# Patient Record
Sex: Male | Born: 1990 | Race: Black or African American | Hispanic: No | Marital: Married | Smoking: Former smoker
Health system: Southern US, Community
[De-identification: ages and names within clinical notes are randomized; demographics above are authoritative.]

## PROBLEM LIST (undated history)

## (undated) DIAGNOSIS — K219 Gastro-esophageal reflux disease without esophagitis: Secondary | ICD-10-CM

## (undated) DIAGNOSIS — IMO0001 Reserved for inherently not codable concepts without codable children: Secondary | ICD-10-CM

## (undated) HISTORY — PX: LEG SURGERY: SHX1003

---
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
## 2010-02-10 ENCOUNTER — Emergency Department (HOSPITAL_COMMUNITY): Admission: EM | Admit: 2010-02-10 | Discharge: 2010-02-10 | Payer: Self-pay | Admitting: Emergency Medicine

## 2010-05-14 IMAGING — US US ABDOMEN COMPLETE
1 series · 14 of 25 positions shown · non-contrast
Comparison: None

CLINICAL DATA: Nausea vomiting.  Epigastric tenderness.

COMPLETE ABDOMINAL ULTRASOUND

[Series 1: us abdomen complete · 0.28mm/px · 14 of 56 slices shown]
[im 1/56]
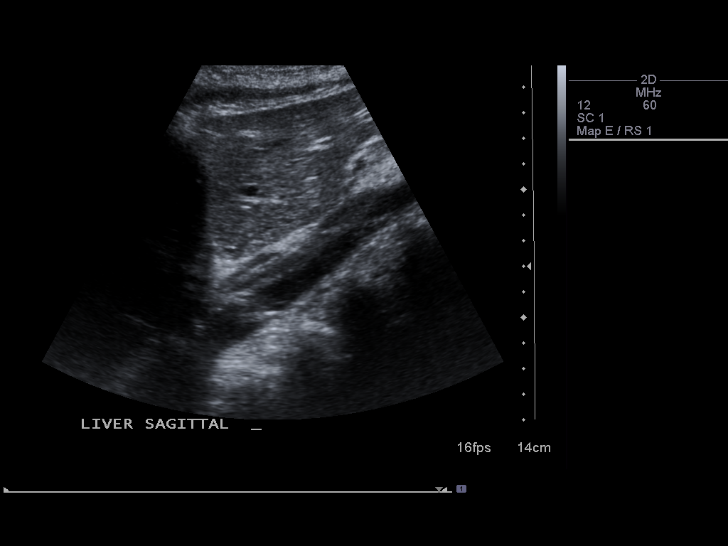
[im 5/56]
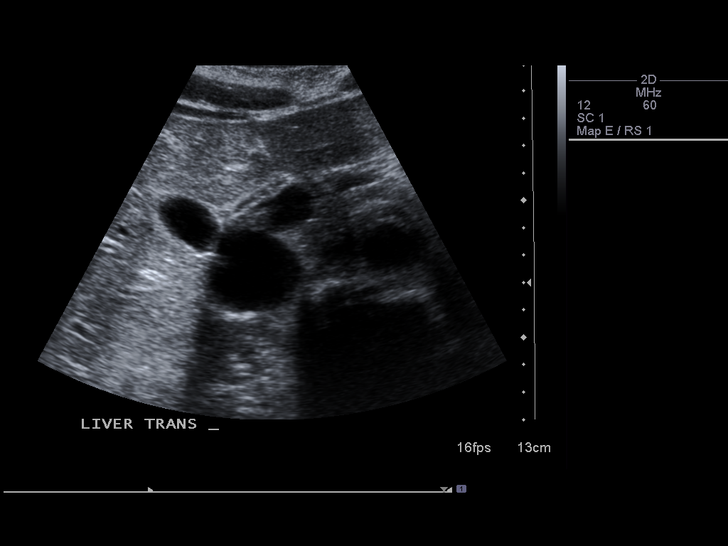
[im 10/56]
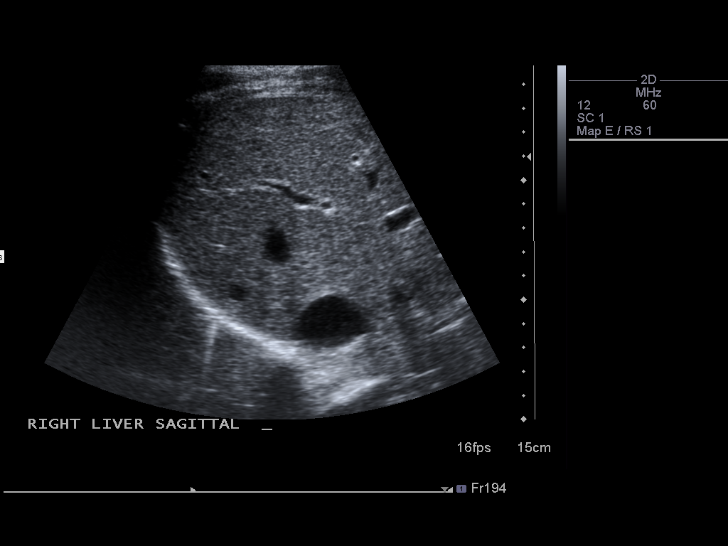
[im 14/56]
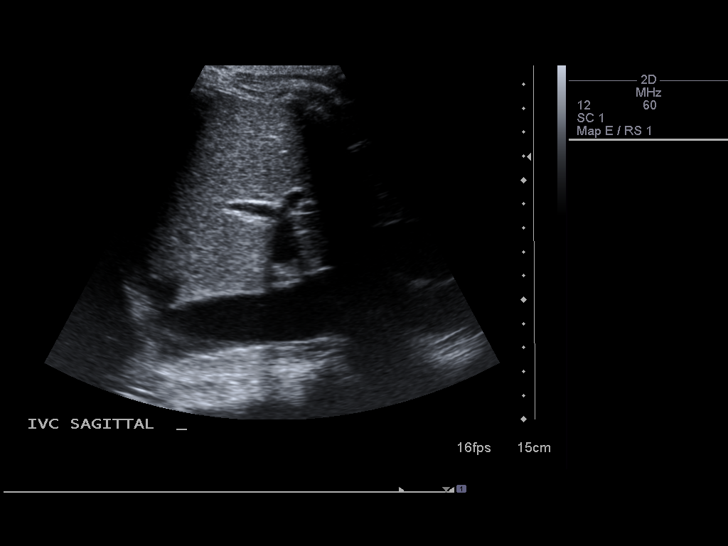
[im 19/56]
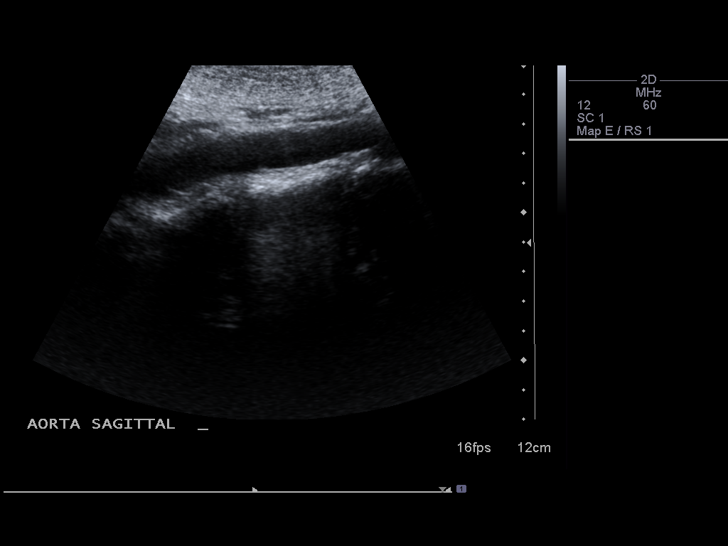
[im 21/56]
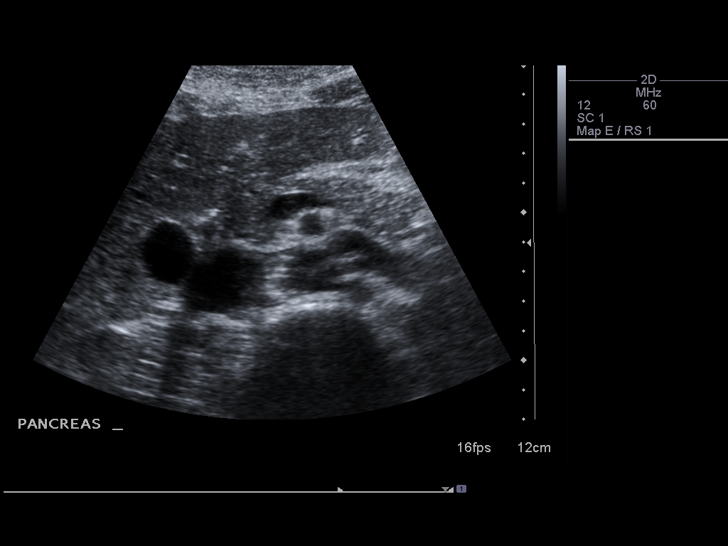
[im 26/56]
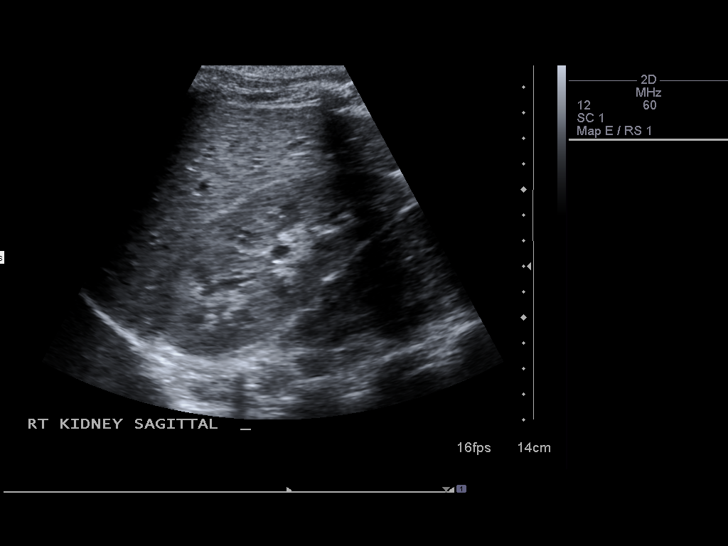
[im 30/56]
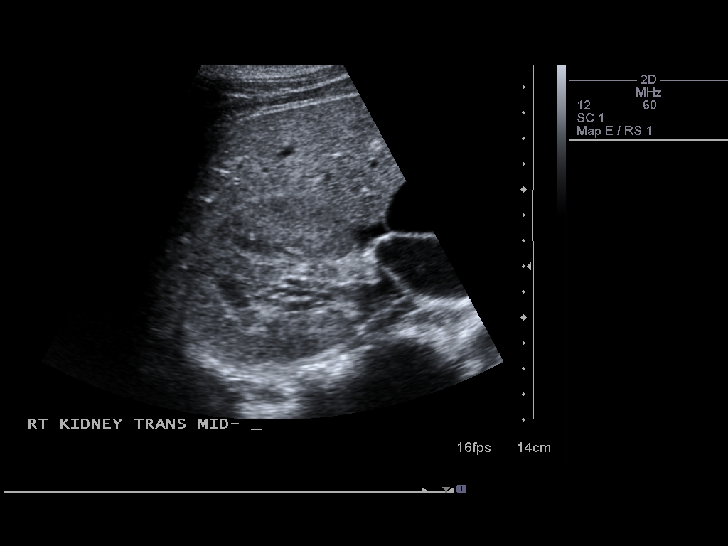
[im 35/56]
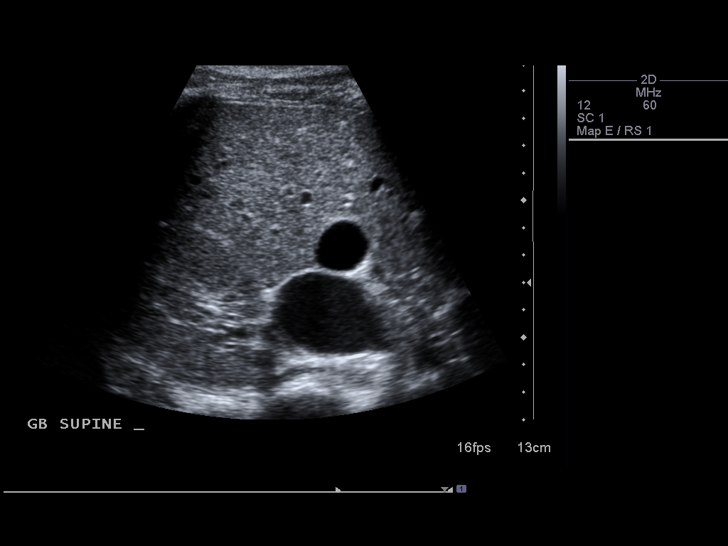
[im 37/56]
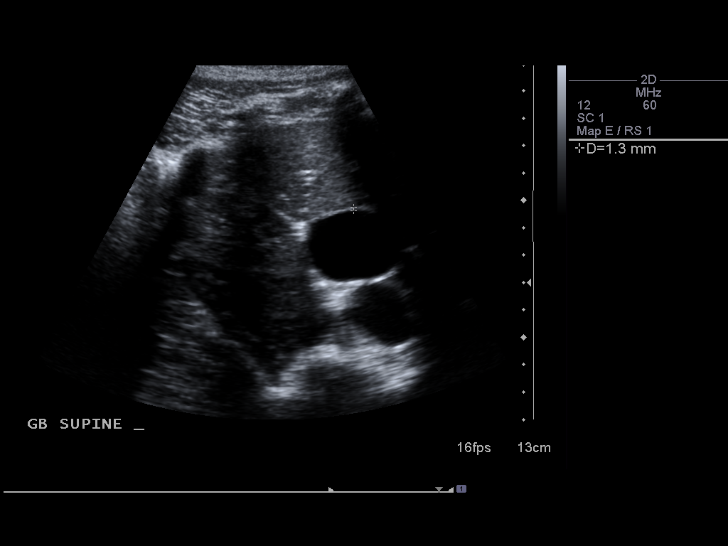
[im 42/56]
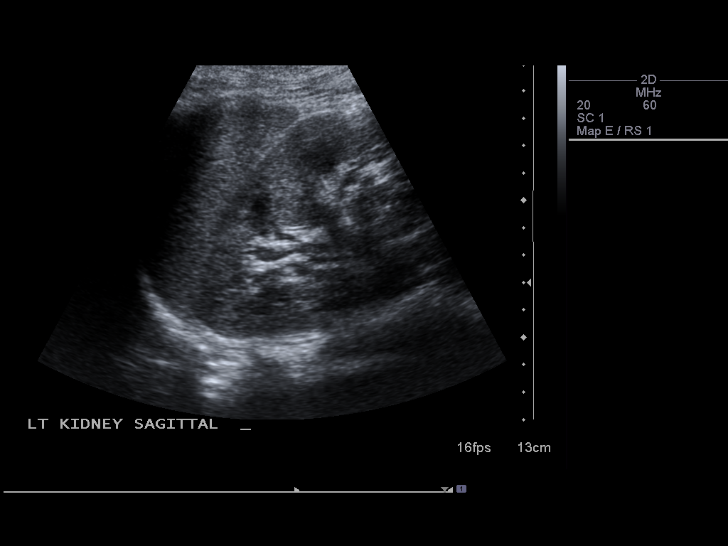
[im 46/56]
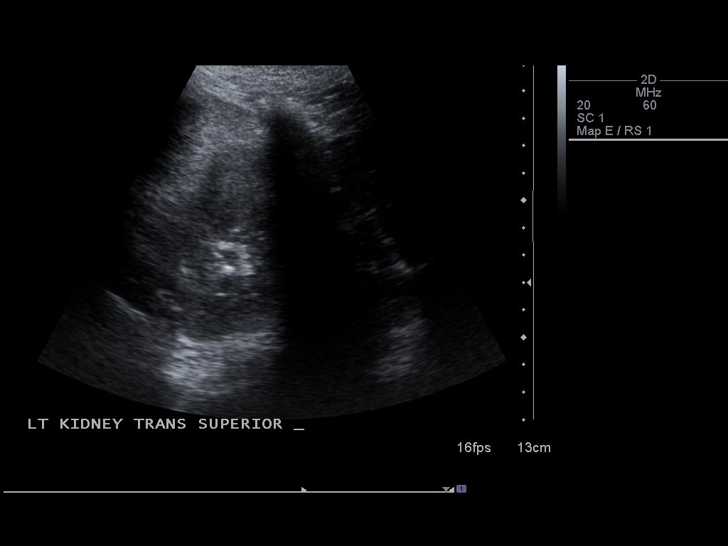
[im 51/56]
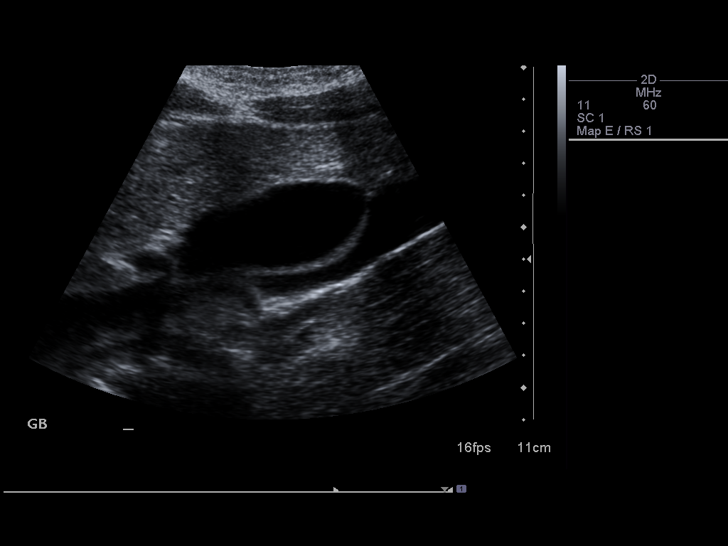
[im 56/56]
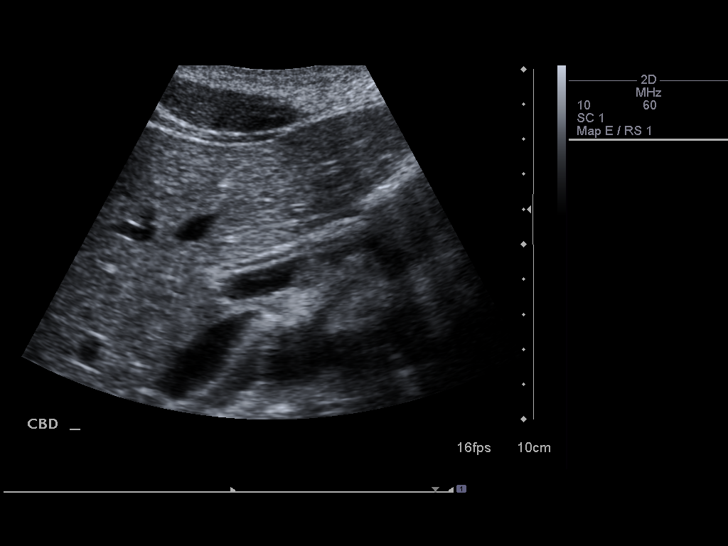

[14 of 25 positions shown; findings below may reference images not displayed]

FINDINGS: Normal gallbladder.  No biliary ductal dilatation.
Common duct measures 3.1 mm in diameter.  No hepatic, splenic,
renal, or pancreatic abnormality.  Spleen measures 7.1 cm in
length.  The right and left kidneys measure 11.4 cm and 11.1 cm in
length, respectively.  Patent IVC.  The maximum diameter of the
abdominal aorta is 2.0 cm.
IMPRESSION: Negative abdominal ultrasound.

## 2011-01-12 LAB — DIFFERENTIAL
Basophils Absolute: 0 10*3/uL (ref 0.0–0.1)
Basophils Relative: 1 % (ref 0–1)
Eosinophils Absolute: 0.1 10*3/uL (ref 0.0–1.2)
Eosinophils Relative: 1 % (ref 0–5)
Lymphocytes Relative: 26 % (ref 24–48)
Lymphs Abs: 1.4 10*3/uL (ref 1.1–4.8)
Monocytes Absolute: 0.4 10*3/uL (ref 0.2–1.2)
Monocytes Relative: 8 % (ref 3–11)
Neutro Abs: 3.5 10*3/uL (ref 1.7–8.0)
Neutrophils Relative %: 64 % (ref 43–71)

## 2011-01-12 LAB — COMPREHENSIVE METABOLIC PANEL
ALT: 14 U/L (ref 0–53)
AST: 18 U/L (ref 0–37)
Albumin: 4 g/dL (ref 3.5–5.2)
Alkaline Phosphatase: 57 U/L (ref 52–171)
BUN: 10 mg/dL (ref 6–23)
CO2: 25 mEq/L (ref 19–32)
Calcium: 9.2 mg/dL (ref 8.4–10.5)
Chloride: 106 mEq/L (ref 96–112)
Creatinine, Ser: 0.82 mg/dL (ref 0.4–1.5)
Glucose, Bld: 87 mg/dL (ref 70–99)
Potassium: 3.5 mEq/L (ref 3.5–5.1)
Sodium: 140 mEq/L (ref 135–145)
Total Bilirubin: 1 mg/dL (ref 0.3–1.2)
Total Protein: 6.6 g/dL (ref 6.0–8.3)

## 2011-01-12 LAB — URINALYSIS, ROUTINE W REFLEX MICROSCOPIC
Glucose, UA: NEGATIVE mg/dL
Hgb urine dipstick: NEGATIVE
Ketones, ur: >80 mg/dL — AB
Leukocytes, UA: NEGATIVE
Nitrite: NEGATIVE
Protein, ur: 100 mg/dL — AB
Specific Gravity, Urine: 1.038 — ABNORMAL HIGH (ref 1.005–1.030)
Urobilinogen, UA: 1 mg/dL (ref 0.0–1.0)
pH: 6 (ref 5.0–8.0)

## 2011-01-12 LAB — CBC
HCT: 36.7 % (ref 36.0–49.0)
Hemoglobin: 12.4 g/dL (ref 12.0–16.0)
MCHC: 33.8 g/dL (ref 31.0–37.0)
MCV: 81.1 fL (ref 78.0–98.0)
Platelets: 180 10*3/uL (ref 150–400)
RBC: 4.52 MIL/uL (ref 3.80–5.70)
RDW: 15.6 % — ABNORMAL HIGH (ref 11.4–15.5)
WBC: 5.4 10*3/uL (ref 4.5–13.5)

## 2011-01-12 LAB — URINE MICROSCOPIC-ADD ON

## 2011-01-12 LAB — LIPASE, BLOOD: Lipase: 12 U/L (ref 11–59)

## 2011-01-12 LAB — GLUCOSE, CAPILLARY

## 2014-04-01 ENCOUNTER — Emergency Department (HOSPITAL_COMMUNITY)
Admission: EM | Admit: 2014-04-01 | Discharge: 2014-04-01 | Disposition: A | Discharge status: Home / Self Care | Payer: Self-pay | Attending: Emergency Medicine | Admitting: Emergency Medicine

## 2014-04-01 ENCOUNTER — Encounter (HOSPITAL_COMMUNITY): Payer: Self-pay | Admitting: Emergency Medicine

## 2014-04-01 ENCOUNTER — Emergency Department (HOSPITAL_COMMUNITY): Payer: Self-pay

## 2014-04-01 DIAGNOSIS — K2100 Gastro-esophageal reflux disease with esophagitis, without bleeding: Secondary | ICD-10-CM | POA: Insufficient documentation

## 2014-04-01 DIAGNOSIS — F17200 Nicotine dependence, unspecified, uncomplicated: Secondary | ICD-10-CM | POA: Insufficient documentation

## 2014-04-01 DIAGNOSIS — N509 Disorder of male genital organs, unspecified: Secondary | ICD-10-CM | POA: Insufficient documentation

## 2014-04-01 LAB — COMPREHENSIVE METABOLIC PANEL
ALK PHOS: 37 U/L — AB (ref 39–117)
ALT: 13 U/L (ref 0–53)
AST: 17 U/L (ref 0–37)
Albumin: 4 g/dL (ref 3.5–5.2)
BILIRUBIN TOTAL: 0.6 mg/dL (ref 0.3–1.2)
BUN: 13 mg/dL (ref 6–23)
CALCIUM: 9.2 mg/dL (ref 8.4–10.5)
CHLORIDE: 104 meq/L (ref 96–112)
CO2: 27 meq/L (ref 19–32)
Creatinine, Ser: 0.97 mg/dL (ref 0.50–1.35)
GLUCOSE: 76 mg/dL (ref 70–99)
Potassium: 3.5 mEq/L — ABNORMAL LOW (ref 3.7–5.3)
SODIUM: 141 meq/L (ref 137–147)
Total Protein: 6.7 g/dL (ref 6.0–8.3)

## 2014-04-01 LAB — CBC WITH DIFFERENTIAL/PLATELET
BASOS ABS: 0 10*3/uL (ref 0.0–0.1)
Basophils Relative: 1 % (ref 0–1)
EOS PCT: 2 % (ref 0–5)
Eosinophils Absolute: 0.1 10*3/uL (ref 0.0–0.7)
HEMATOCRIT: 38.8 % — AB (ref 39.0–52.0)
Hemoglobin: 13 g/dL (ref 13.0–17.0)
LYMPHS ABS: 1.8 10*3/uL (ref 0.7–4.0)
LYMPHS PCT: 40 % (ref 12–46)
MCH: 27.1 pg (ref 26.0–34.0)
MCHC: 33.5 g/dL (ref 30.0–36.0)
MCV: 81 fL (ref 78.0–100.0)
Monocytes Absolute: 0.4 10*3/uL (ref 0.1–1.0)
Monocytes Relative: 8 % (ref 3–12)
NEUTROS ABS: 2.3 10*3/uL (ref 1.7–7.7)
Neutrophils Relative %: 49 % (ref 43–77)
PLATELETS: 175 10*3/uL (ref 150–400)
RBC: 4.79 MIL/uL (ref 4.22–5.81)
RDW: 14.5 % (ref 11.5–15.5)
WBC: 4.6 10*3/uL (ref 4.0–10.5)

## 2014-04-01 MED ORDER — SUCRALFATE 1 GM/10ML PO SUSP: 1.0000 g | Freq: Three times a day (TID) | ORAL | Status: DC

## 2014-04-01 MED ORDER — RANITIDINE HCL 150 MG PO TABS: 150.0000 mg | ORAL_TABLET | Freq: Two times a day (BID) | ORAL | Status: DC

## 2014-04-01 NOTE — ED Notes (Signed)
Pt states constant  heartburn with nausea and vomiting when eating past 7pm

## 2014-04-01 NOTE — ED Notes (Signed)
Pt. Stated, I've been having acid reflux in the morning when I wake up, its like Im vomiting acid for 2 months.  I also have a bump on my testicle.

## 2014-04-01 NOTE — ED Notes (Signed)
Pt refused wc. Pt alert x4 escort to door. Steady gait.

## 2014-04-01 NOTE — Discharge Instructions (Signed)
Gastroesophageal Reflux Disease, Adult  Gastroesophageal reflux disease (GERD) happens when acid from your stomach flows up into the esophagus. When acid comes in contact with the esophagus, the acid causes soreness (inflammation) in the esophagus. Over time, GERD may create small holes (ulcers) in the lining of the esophagus.  CAUSES   · Increased body weight. This puts pressure on the stomach, making acid rise from the stomach into the esophagus.  · Smoking. This increases acid production in the stomach.  · Drinking alcohol. This causes decreased pressure in the lower esophageal sphincter (valve or ring of muscle between the esophagus and stomach), allowing acid from the stomach into the esophagus.  · Late evening meals and a full stomach. This increases pressure and acid production in the stomach.  · A malformed lower esophageal sphincter.  Sometimes, no cause is found.  SYMPTOMS   · Burning pain in the lower part of the mid-chest behind the breastbone and in the mid-stomach area. This may occur twice a week or more often.  · Trouble swallowing.  · Sore throat.  · Dry cough.  · Asthma-like symptoms including chest tightness, shortness of breath, or wheezing.  DIAGNOSIS   Your caregiver may be able to diagnose GERD based on your symptoms. In some cases, X-rays and other tests may be done to check for complications or to check the condition of your stomach and esophagus.  TREATMENT   Your caregiver may recommend over-the-counter or prescription medicines to help decrease acid production. Ask your caregiver before starting or adding any new medicines.   HOME CARE INSTRUCTIONS   · Change the factors that you can control. Ask your caregiver for guidance concerning weight loss, quitting smoking, and alcohol consumption.  · Avoid foods and drinks that make your symptoms worse, such as:  ¨ Caffeine or alcoholic drinks.  ¨ Chocolate.  ¨ Peppermint or mint flavorings.  ¨ Garlic and onions.  ¨ Spicy foods.  ¨ Citrus fruits,  such as oranges, lemons, or limes.  ¨ Tomato-based foods such as sauce, chili, salsa, and pizza.  ¨ Fried and fatty foods.  · Avoid lying down for the 3 hours prior to your bedtime or prior to taking a nap.  · Eat small, frequent meals instead of large meals.  · Wear loose-fitting clothing. Do not wear anything tight around your waist that causes pressure on your stomach.  · Raise the head of your bed 6 to 8 inches with wood blocks to help you sleep. Extra pillows will not help.  · Only take over-the-counter or prescription medicines for pain, discomfort, or fever as directed by your caregiver.  · Do not take aspirin, ibuprofen, or other nonsteroidal anti-inflammatory drugs (NSAIDs).  SEEK IMMEDIATE MEDICAL CARE IF:   · You have pain in your arms, neck, jaw, teeth, or back.  · Your pain increases or changes in intensity or duration.  · You develop nausea, vomiting, or sweating (diaphoresis).  · You develop shortness of breath, or you faint.  · Your vomit is green, yellow, black, or looks like coffee grounds or blood.  · Your stool is red, bloody, or black.  These symptoms could be signs of other problems, such as heart disease, gastric bleeding, or esophageal bleeding.  MAKE SURE YOU:   · Understand these instructions.  · Will watch your condition.  · Will get help right away if you are not doing well or get worse.  Document Released: 06/30/2005 Document Revised: 12/13/2011 Document Reviewed: 04/09/2011  ExitCare® Patient   Information ©2015 [employer]. This information is not intended to replace advice given to you by your health care provider. Make sure you discuss any questions you have with your health care provider.  Food Choices for Gastroesophageal Reflux Disease  When you have gastroesophageal reflux disease (GERD), the foods you eat and your eating habits are very important. Choosing the right foods can help ease the discomfort of GERD.  WHAT GENERAL GUIDELINES DO I NEED TO FOLLOW?  · Choose fruits,  vegetables, whole grains, low-fat dairy products, and low-fat meat, fish, and poultry.  · Limit fats such as oils, salad dressings, butter, nuts, and avocado.  · Keep a food diary to identify foods that cause symptoms.  · Avoid foods that cause reflux. These may be different for different people.  · Eat frequent small meals instead of three large meals each day.  · Eat your meals slowly, in a relaxed setting.  · Limit fried foods.  · Cook foods using methods other than frying.  · Avoid drinking alcohol.  · Avoid drinking large amounts of liquids with your meals.  · Avoid bending over or lying down until 2-3 hours after eating.  WHAT FOODS ARE NOT RECOMMENDED?  The following are some foods and drinks that may worsen your symptoms:  Vegetables  Tomatoes. Tomato juice. Tomato and spaghetti sauce. Chili peppers. Onion and garlic. Horseradish.  Fruits  Oranges, grapefruit, and lemon (fruit and juice).  Meats  High-fat meats, fish, and poultry. This includes hot dogs, ribs, ham, sausage, salami, and bacon.  Dairy  Whole milk and chocolate milk. Sour cream. Cream. Butter. Ice cream. Cream cheese.   Beverages  Coffee and tea, with or without caffeine. Carbonated beverages or energy drinks.  Condiments  Hot sauce. Barbecue sauce.   Sweets/Desserts  Chocolate and cocoa. Donuts. Peppermint and spearmint.  Fats and Oils  High-fat foods, including French fries and potato chips.  Other  Vinegar. Strong spices, such as black pepper, white pepper, red pepper, cayenne, curry powder, cloves, ginger, and chili powder.  The items listed above may not be a complete list of foods and beverages to avoid. Contact your dietitian for more information.  Document Released: 09/20/2005 Document Revised: 09/25/2013 Document Reviewed: 07/25/2013  ExitCare® Patient Information ©2015 [employer]. This information is not intended to replace advice given to you by your health care provider. Make sure you discuss any questions you have with your  health care provider.

## 2014-04-01 NOTE — ED Notes (Signed)
Pt transported to xray 

## 2014-04-01 NOTE — ED Notes (Signed)
Pt request note to show to general manager stating he has acid reflux. Provider informed.

## 2014-04-01 NOTE — ED Provider Notes (Signed)
CSN: [id number]     Arrival date & time 04/01/14  1149 History   First MD Initiated Contact with Patient 04/01/14 1600     Chief Complaint  Patient presents with  . Gastrophageal Reflux  . Emesis  . Testicle Pain     (Consider location/radiation/quality/duration/timing/severity/associated sxs/prior Treatment) HPI Comments: Patient presents to the emergency department for multiple problems. Patient is complaining of increasing frequency of heartburn with nausea and vomiting. Patient reports that he is awakened early in the morning most mornings with nausea and vomits. There is no recurrent vomiting after that the rest of the day, however. He has been having frequent heartburn symptoms with this. He was told several years ago that he had acid reflux, that medicine for a while and it went away. The symptoms are similar.  Patient also concerned about a bump on his left testicle. Patient has had a nontender nodule on the scrotum for some time. He would like for her to be checked out. He says he saw the nurse at a clinic, had STD testing, but she did not know what this area was. There has not been any drainage from the region.  Patient is a 23 y.o. male presenting with GERD, vomiting, and testicular pain.  Gastrophageal Reflux  Emesis Testicle Pain    History reviewed. No pertinent past medical history. History reviewed. No pertinent past surgical history. No family history on file. History  Substance Use Topics  . Smoking status: Current Every Day Smoker  . Smokeless tobacco: Not on file  . Alcohol Use: No    Review of Systems  Gastrointestinal: Positive for nausea and vomiting.  Genitourinary:       Scrotal lesion  All other systems reviewed and are negative.     Allergies  Review of patient's allergies indicates no known allergies.  Home Medications   Prior to Admission medications   Medication Sig Start Date End Date Taking? Authorizing Provider  Simethicone (GAS-X PO)  Take 1 tablet by mouth daily as needed (gas).   Yes Historical Provider, MD   BP 121/86  Pulse 69  Temp(Src) 98.7 F (37.1 C) (Oral)  Resp 15  Wt 170 lb (77.111 kg)  SpO2 100% Physical Exam  Constitutional: He is oriented to person, place, and time. He appears well-developed and well-nourished. No distress.  HENT:  Head: Normocephalic and atraumatic.  Right Ear: Hearing normal.  Left Ear: Hearing normal.  Nose: Nose normal.  Mouth/Throat: Oropharynx is clear and moist and mucous membranes are normal.  Eyes: Conjunctivae and EOM are normal. Pupils are equal, round, and reactive to light.  Neck: Normal range of motion. Neck supple.  Cardiovascular: Regular rhythm, S1 normal and S2 normal.  Exam reveals no gallop and no friction rub.   No murmur heard. Pulmonary/Chest: Effort normal and breath sounds normal. No respiratory distress. He exhibits no tenderness.  Abdominal: Soft. Normal appearance and bowel sounds are normal. There is no hepatosplenomegaly. There is no tenderness. There is no rebound, no guarding, no tenderness at McBurney's point and negative Murphy's sign. No hernia.  Genitourinary: Penis normal.     Musculoskeletal: Normal range of motion.  Neurological: He is alert and oriented to person, place, and time. He has normal strength. No cranial nerve deficit or sensory deficit. Coordination normal. GCS eye subscore is 4. GCS verbal subscore is 5. GCS motor subscore is 6.  Skin: Skin is warm, dry and intact. No rash noted. No cyanosis.  Psychiatric: He has a normal mood and  affect. His speech is normal and behavior is normal. Thought content normal.    ED Course  Procedures (including critical care time) Labs Review Labs Reviewed  CBC WITH DIFFERENTIAL  COMPREHENSIVE METABOLIC PANEL    Imaging Review No results found.   EKG Interpretation None      MDM   Final diagnoses:  None  GERD Sebaceous Cyst  Patient complaining of increased gastroesophageal  reflux disease type symptoms. He has been previously diagnosed with GERD, not currently taking any medications. He has a benign abdominal exam. No tenderness in the right upper quadrant. No signs of peritonitis. He has not had any melena. Blood work is stable. Acute abdominal series unremarkable. Will initiate antacid coverage, refer to history of present illness.  Patient also complaining of a nodule on his scrotum. Examination is consistent with a sebaceous cyst, does not require any intervention. No other lesions or concern for STD.    Gilda Creasehristopher J. Pollina, MD 04/01/14 [phone]

## 2015-07-01 ENCOUNTER — Emergency Department (HOSPITAL_COMMUNITY)
Admission: EM | Admit: 2015-07-01 | Discharge: 2015-07-01 | Disposition: A | Discharge status: Home / Self Care | Payer: Self-pay | Source: Home / Self Care | Attending: Family Medicine | Admitting: Family Medicine

## 2015-07-01 ENCOUNTER — Encounter (HOSPITAL_COMMUNITY): Payer: Self-pay | Admitting: *Deleted

## 2015-07-01 DIAGNOSIS — L0291 Cutaneous abscess, unspecified: Secondary | ICD-10-CM

## 2015-07-01 MED ORDER — DOXYCYCLINE HYCLATE 100 MG PO CAPS: 100.0000 mg | ORAL_CAPSULE | Freq: Two times a day (BID) | ORAL | Status: AC

## 2015-07-01 NOTE — Discharge Instructions (Signed)

## 2015-07-01 NOTE — ED Provider Notes (Signed)
CSN: [id number]     Arrival date & time 07/01/15  1340 History   First MD Initiated Contact with Patient 07/01/15 1444     Chief Complaint  Patient presents with  . Abscess   (Consider location/radiation/quality/duration/timing/severity/associated sxs/prior Treatment) Patient is a 24 y.o. male presenting with abscess. The history is provided by the patient.  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 He developed an abscess on his left cheek several days ago. It's been draining yellow pus since. History reviewed. No pertinent past medical history. History reviewed. No pertinent past surgical history. History reviewed. No pertinent family history. Social History  Substance Use Topics  . Smoking status: Current Every Day Smoker  . Smokeless tobacco: None  . Alcohol Use: No    Review of Systems  Constitutional: Negative.  Negative for fever.  HENT: Negative.   Eyes: Negative.   Respiratory: Negative.   Cardiovascular: Negative.   Gastrointestinal: Negative.  Negative for nausea and vomiting.  Skin: Positive for rash and wound.  Neurological: Negative for headaches.    Allergies  Review of patient's allergies indicates no known allergies.  Home Medications   Prior to Admission medications   Medication Sig Start Date End Date Taking? Authorizing Provider  ranitidine (ZANTAC) 150 MG tablet Take 1 tablet  (150 mg total) by mouth 2 (two) times daily. 04/01/14   Gilda Crease, MD  Simethicone (GAS-X PO) Take 1 tablet by mouth daily as needed (gas).    Historical Provider, MD  sucralfate (CARAFATE) 1 GM/10ML suspension Take 10 mLs (1 g total) by mouth 4 (four) times daily -  with meals and at bedtime. 04/01/14   Gilda Crease, MD   Meds Ordered and Administered this Visit  Medications - No data to display  BP 120/81 mmHg  Pulse 60  Temp(Src) 98 F (36.7 C) (Oral)  Resp 18  SpO2 98% No data found.   Physical Exam  Constitutional: He appears well-developed and well-nourished.  HENT:  Head: Normocephalic.  Right Ear: External ear normal.  Left Ear: External ear normal.  Mouth/Throat: Oropharynx is clear and moist.  Eyes: Conjunctivae and EOM are normal. Pupils are equal, round, and reactive to light.  Neck: Normal range of motion. Neck supple.  Pulmonary/Chest: Effort normal.  Skin:  1 cm draining yellow pustule on left malar arch of his cheek.  Psychiatric: He has a normal mood and affect. His behavior is normal.  Nursing note and vitals reviewed.   ED Course  Procedures (including critical care time)  I was able to express a moderate amount of pus from the left cheek. I explained how patient can massage history gently to continue this process.  MDM       ICD-9-CM ICD-10-CM   1. Abscess 682.9 L02.91 doxycycline (VIBRAMYCIN) 100 MG capsule     Signed, Elvina Sidle, MD  Elvina Sidle, MD 07/01/15 [phone]

## 2015-07-01 NOTE — ED Notes (Signed)
Pt  Has   A facial  abcess  To  l  Side  Of  Face        That   He noticed   About  1  Week  Ago  He       denys  Any      injuury      He  Has  Had  On other  Areas  Of  Body

## 2015-09-19 ENCOUNTER — Emergency Department (HOSPITAL_COMMUNITY): Payer: Self-pay

## 2015-09-19 ENCOUNTER — Encounter (HOSPITAL_COMMUNITY): Payer: Self-pay | Admitting: Emergency Medicine

## 2015-09-19 ENCOUNTER — Emergency Department (HOSPITAL_COMMUNITY)
Admission: EM | Admit: 2015-09-19 | Discharge: 2015-09-19 | Disposition: A | Discharge status: Home / Self Care | Payer: Self-pay | Attending: Emergency Medicine | Admitting: Emergency Medicine

## 2015-09-19 DIAGNOSIS — R61 Generalized hyperhidrosis: Secondary | ICD-10-CM | POA: Insufficient documentation

## 2015-09-19 DIAGNOSIS — Z87891 Personal history of nicotine dependence: Secondary | ICD-10-CM | POA: Insufficient documentation

## 2015-09-19 DIAGNOSIS — R112 Nausea with vomiting, unspecified: Secondary | ICD-10-CM

## 2015-09-19 DIAGNOSIS — Z792 Long term (current) use of antibiotics: Secondary | ICD-10-CM | POA: Insufficient documentation

## 2015-09-19 DIAGNOSIS — L720 Epidermal cyst: Secondary | ICD-10-CM | POA: Insufficient documentation

## 2015-09-19 DIAGNOSIS — K219 Gastro-esophageal reflux disease without esophagitis: Secondary | ICD-10-CM | POA: Insufficient documentation

## 2015-09-19 DIAGNOSIS — Z79899 Other long term (current) drug therapy: Secondary | ICD-10-CM | POA: Insufficient documentation

## 2015-09-19 HISTORY — DX: Reserved for inherently not codable concepts without codable children: IMO0001

## 2015-09-19 HISTORY — DX: Gastro-esophageal reflux disease without esophagitis: K21.9

## 2015-09-19 LAB — LIPASE, BLOOD: Lipase: 21 U/L (ref 11–51)

## 2015-09-19 LAB — COMPREHENSIVE METABOLIC PANEL WITH GFR
ALT: 22 U/L (ref 17–63)
AST: 25 U/L (ref 15–41)
Albumin: 4.5 g/dL (ref 3.5–5.0)
Alkaline Phosphatase: 36 U/L — ABNORMAL LOW (ref 38–126)
Anion gap: 8 (ref 5–15)
BUN: 14 mg/dL (ref 6–20)
CO2: 24 mmol/L (ref 22–32)
Calcium: 9.4 mg/dL (ref 8.9–10.3)
Chloride: 106 mmol/L (ref 101–111)
Creatinine, Ser: 1 mg/dL (ref 0.61–1.24)
GFR calc Af Amer: >60 mL/min
GFR calc non Af Amer: >60 mL/min
Glucose, Bld: 102 mg/dL — ABNORMAL HIGH (ref 65–99)
Potassium: 3.8 mmol/L (ref 3.5–5.1)
Sodium: 138 mmol/L (ref 135–145)
Total Bilirubin: 0.8 mg/dL (ref 0.3–1.2)
Total Protein: 7.2 g/dL (ref 6.5–8.1)

## 2015-09-19 LAB — CBC
HEMATOCRIT: 43.9 % (ref 39.0–52.0)
Hemoglobin: 15 g/dL (ref 13.0–17.0)
MCH: 27.4 pg (ref 26.0–34.0)
MCHC: 34.2 g/dL (ref 30.0–36.0)
MCV: 80.1 fL (ref 78.0–100.0)
Platelets: 196 10*3/uL (ref 150–400)
RBC: 5.48 MIL/uL (ref 4.22–5.81)
RDW: 14.8 % (ref 11.5–15.5)
WBC: 6.8 10*3/uL (ref 4.0–10.5)

## 2015-09-19 LAB — URINALYSIS, ROUTINE W REFLEX MICROSCOPIC
Bilirubin Urine: NEGATIVE
Glucose, UA: NEGATIVE mg/dL
Hgb urine dipstick: NEGATIVE
Ketones, ur: 15 mg/dL — AB
Leukocytes, UA: NEGATIVE
Nitrite: NEGATIVE
Protein, ur: NEGATIVE mg/dL
Specific Gravity, Urine: 1.029 (ref 1.005–1.030)
pH: 7.5 (ref 5.0–8.0)

## 2015-09-19 MED ORDER — SODIUM CHLORIDE 0.9 % IV BOLUS (SEPSIS)
1000.0000 mL | Freq: Once | INTRAVENOUS | Status: AC
  Administered 2015-09-19: 1000 mL via INTRAVENOUS

## 2015-09-19 MED ORDER — ONDANSETRON 4 MG PO TBDP: 4.0000 mg | ORAL_TABLET | Freq: Once | ORAL | Status: DC | PRN

## 2015-09-19 MED ORDER — ONDANSETRON HCL 4 MG/2ML IJ SOLN
4.0000 mg | Freq: Once | INTRAMUSCULAR | Status: AC
  Administered 2015-09-19: 4 mg via INTRAVENOUS
  Filled 2015-09-19: qty 2

## 2015-09-19 MED ORDER — RANITIDINE HCL 150 MG PO TABS: 150.0000 mg | ORAL_TABLET | Freq: Two times a day (BID) | ORAL | Status: AC

## 2015-09-19 MED ORDER — ESOMEPRAZOLE MAGNESIUM 40 MG PO CPDR: 40.0000 mg | DELAYED_RELEASE_CAPSULE | Freq: Every day | ORAL | Status: AC

## 2015-09-19 MED ORDER — GI COCKTAIL ~~LOC~~
30.0000 mL | Freq: Once | ORAL | Status: AC
  Administered 2015-09-19: 30 mL via ORAL
  Filled 2015-09-19: qty 30

## 2015-09-19 NOTE — ED Notes (Signed)
Pt okay'd by PA to go to US before IV placement

## 2015-09-19 NOTE — ED Provider Notes (Signed)
CSN: [id number]     Arrival date & time 09/19/15  1548 History  By signing my name below, I, Elon Spanner, attest that this documentation has been prepared under the direction and in the presence of Fayrene Helper, PA-C. Electronically Signed: Elon Spanner ED Scribe. 09/19/2015. 4:35 PM.    Chief Complaint  Patient presents with  . Gastroesophageal Reflux  . Emesis    Patient is a 24 y.o. male presenting with vomiting. The history is provided by the patient. No language interpreter was used.  Emesis Associated symptoms: abdominal pain and chills    HPI Comments: Angel Lamb is a 24 y.o. male with no chronic conditions who presents to the Emergency Department complaining of cramping, non-radiating, 3/10, upper abdominal pain onset 3 days ago with associated sensation of testicles "being pulled up"; precipitated by eating.  Associated symptoms include nausea and vomiting yellow/green contents yesterday as well as sweating with hot/cold chills yesterday.  He reports a hx of similar episodes once per month that typically last one day, however, he has had two of these episodes this month.  He suspects these symptoms are a result of GERD but has not received an official diagnosis of GERD.  He has taken Nexium in the past but has tried no medications for today's episodes.  He denies hx of DM.  Patient does not consume alcohol.  He denies fever, sneezing, cough, dysuria, hematuria.    Past Medical History  Diagnosis Date  . Reflux    Past Surgical History  Procedure Laterality Date  . Leg surgery      lower left leg, upper right leg   No family history on file. Social History  Substance Use Topics  . Smoking status: Former Games developer  . Smokeless tobacco: None  . Alcohol Use: No    Review of Systems  Constitutional: Positive for chills and diaphoresis. Negative for fever.  HENT: Negative for sneezing.   Respiratory: Negative for cough.   Gastrointestinal: Positive for vomiting and abdominal  pain.  Genitourinary: Negative for dysuria and hematuria.      Allergies  Review of patient's allergies indicates no known allergies.  Home Medications   Prior to Admission medications   Medication Sig Start Date End Date Taking? Authorizing Provider  doxycycline (VIBRAMYCIN) 100 MG capsule Take 1 capsule (100 mg total) by mouth 2 (two) times daily. 07/01/15   Elvina Sidle, MD  ranitidine (ZANTAC) 150 MG tablet Take 1 tablet (150 mg total) by mouth 2 (two) times daily. 04/01/14   Gilda Crease, MD   BP 131/78 mmHg  Pulse 51  Temp(Src) 98.5 F (36.9 C) (Oral)  Resp 18  Ht  (1.803 m)  Wt 180 lb (81.647 kg)  BMI 25.12 kg/m2  SpO2 100% Physical Exam  Constitutional: He is oriented to person, place, and time. He appears well-developed and well-nourished. No distress.  HENT:  Head: Normocephalic and atraumatic.  Eyes: Conjunctivae and EOM are normal.  Neck: Neck supple. No tracheal deviation present.  Cardiovascular: Normal rate.   Pulmonary/Chest: Effort normal. No respiratory distress.  Abdominal:  Mild diffuse abdominal tenderness without guarding or rebound abdominal tenderness; otherwise soft.   Genitourinary:  Normal circumcised penis free of lesion and rash.  inclusion cyst noted to the left inguinal region adjacent to shaft.  Testicles are nontender with normal lie.  No inguinal lymphadenopathy or inguinal  hernia noted.    Musculoskeletal: Normal range of motion.  Neurological: He is alert and oriented to person, place, and  time.  Skin: Skin is warm and dry.  Psychiatric: He has a normal mood and affect. His behavior is normal.  Nursing note and vitals reviewed.   ED Course  Procedures (including critical care time)  DIAGNOSTIC STUDIES: Oxygen Saturation is 100% on RA, normal by my interpretation.    COORDINATION OF CARE:  4:43 PM Will order GI cocktail and labs.  Patient acknowledges and agrees with plan.    Labs Review Labs Reviewed   COMPREHENSIVE METABOLIC PANEL - Abnormal; Notable for the following:    Glucose, Bld 102 (*)    Alkaline Phosphatase 36 (*)    All other components within normal limits  URINALYSIS, ROUTINE W REFLEX MICROSCOPIC (NOT AT [facility]) - Abnormal; Notable for the following:    Ketones, ur 15 (*)    All other components within normal limits  LIPASE, BLOOD  CBC   Results for orders placed or performed during the hospital encounter of 09/19/15  Lipase, blood  Result Value Ref Range   Lipase 21 11 - 51 U/L  Comprehensive metabolic panel  Result Value Ref Range   Sodium 138 135 - 145 mmol/L   Potassium 3.8 3.5 - 5.1 mmol/L   Chloride 106 101 - 111 mmol/L   CO2 24 22 - 32 mmol/L   Glucose, Bld 102 (H) 65 - 99 mg/dL   BUN 14 6 - 20 mg/dL   Creatinine, Ser 5.401.00 0.61 - 1.24 mg/dL   Calcium 9.4 8.9 - 98.110.3 mg/dL   Total Protein 7.2 6.5 - 8.1 g/dL   Albumin 4.5 3.5 - 5.0 g/dL   AST 25 15 - 41 U/L   ALT 22 17 - 63 U/L   Alkaline Phosphatase 36 (L) 38 - 126 U/L   Total Bilirubin 0.8 0.3 - 1.2 mg/dL   GFR calc non Af Amer >60 >60 mL/min   GFR calc Af Amer >60 >60 mL/min   Anion gap 8 5 - 15  CBC  Result Value Ref Range   WBC 6.8 4.0 - 10.5 K/uL   RBC 5.48 4.22 - 5.81 MIL/uL   Hemoglobin 15.0 13.0 - 17.0 g/dL   HCT 19.143.9 47.839.0 - 29.552.0 %   MCV 80.1 78.0 - 100.0 fL   MCH 27.4 26.0 - 34.0 pg   MCHC 34.2 30.0 - 36.0 g/dL   RDW 62.114.8 30.811.5 - 65.715.5 %   Platelets 196 150 - 400 K/uL  Urinalysis, Routine w reflex microscopic (not at [facility])  Result Value Ref Range   Color, Urine YELLOW YELLOW   APPearance CLEAR CLEAR   Specific Gravity, Urine 1.029 1.005 - 1.030   pH 7.5 5.0 - 8.0   Glucose, UA NEGATIVE NEGATIVE mg/dL   Hgb urine dipstick NEGATIVE NEGATIVE   Bilirubin Urine NEGATIVE NEGATIVE   Ketones, ur 15 (A) NEGATIVE mg/dL   Protein, ur NEGATIVE NEGATIVE mg/dL   Nitrite NEGATIVE NEGATIVE   Leukocytes, UA NEGATIVE NEGATIVE   [place] Abdomen Complete  09/19/2015  CLINICAL DATA:  Upper abdominal pain,  history reflux, smoking EXAM: ULTRASOUND ABDOMEN COMPLETE COMPARISON:  02/24/2009 FINDINGS: Gallbladder: Normally distended without stones or wall thickening. No pericholecystic fluid or sonographic Murphy sign. Common bile duct: Diameter: Normal caliber 2 mm diameter Liver: Normal appearance IVC: Normal appearance Pancreas: Normal appearance Spleen: Normal appearance, 5.3 cm length Right Kidney: Length: 11.3 cm. Normal morphology without mass or hydronephrosis. Left Kidney: Length: 11.3 cm. Normal morphology without mass or hydronephrosis. Abdominal aorta: Normal caliber Other findings: No free fluid IMPRESSION: Normal exam. Electronically  Signed   By: Ulyses Southward M.D.   On: 09/19/2015 17:58       MDM   Final diagnoses:  Gastroesophageal reflux disease, esophagitis presence not specified  Non-intractable vomiting with nausea, unspecified vomiting type    BP 131/78 mmHg  Pulse 51  Temp(Src) 98.5 F (36.9 C) (Oral)  Resp 18  Ht  (1.803 m)  Wt 81.647 kg  BMI 25.12 kg/m2  SpO2 100%   I personally performed the services described in this documentation, which was scribed in my presence. The recorded information has been reviewed and is accurate.     7:23 PM Pt with hx of GERD here with upper abd pain, n/v/d.  Likely viral GI.  Labs are reassuring, pt felt better after IVF and zofran.  GI cocktails helped.  abd US unremarkable.  Low suspicion of perforation or acute emergent medical condition.  GI referral given.  Recommend PPI, and H2 for sxs treatment.  Return precaution discussed.  Fayrene Helper, PA-C 09/19/15 1925  Benjiman Core, MD 09/19/15 [phone]

## 2015-09-19 NOTE — Discharge Instructions (Signed)

## 2015-09-19 NOTE — ED Notes (Signed)
Started having an "acid reflux attack" yesterday with severe abd pain, today started throwing up yellow and green liquid-- also had diarrhea x 3 today--

## 2015-12-24 ENCOUNTER — Emergency Department (INDEPENDENT_AMBULATORY_CARE_PROVIDER_SITE_OTHER)
Admission: EM | Admit: 2015-12-24 | Discharge: 2015-12-24 | Disposition: A | Discharge status: Home / Self Care | Payer: Self-pay | Source: Home / Self Care | Attending: Emergency Medicine | Admitting: Emergency Medicine

## 2015-12-24 ENCOUNTER — Encounter (HOSPITAL_COMMUNITY): Payer: Self-pay | Admitting: Emergency Medicine

## 2015-12-24 DIAGNOSIS — L0201 Cutaneous abscess of face: Secondary | ICD-10-CM

## 2015-12-24 MED ORDER — SULFAMETHOXAZOLE-TRIMETHOPRIM 800-160 MG PO TABS: 1.0000 | ORAL_TABLET | Freq: Two times a day (BID) | ORAL | Status: AC

## 2015-12-24 MED ORDER — BENZOYL PEROXIDE 10 % EX LIQD: CUTANEOUS | Status: AC

## 2015-12-24 NOTE — ED Provider Notes (Signed)
CSN: [id number]     Arrival date & time 12/24/15  1314 History   First MD Initiated Contact with Patient 12/24/15 1450     Chief Complaint  Patient presents with  . Cyst   (Consider location/radiation/quality/duration/timing/severity/associated sxs/prior Treatment) HPI  He is a 25 year old man here for evaluation of facial abscesses.  He states he had an abscess of the left cheek last fall after starting work at [employer]. He was treated with antibiotics and it improved. In the last week he has had 2 abscesses pop up, one on his nasal bridge and one above the left eye. The wound the nasal bridge has been draining the last 2 days. No fevers or chills. He does not use any sort of face wash or acne product.  Past Medical History  Diagnosis Date  . Reflux    Past Surgical History  Procedure Laterality Date  . Leg surgery      lower left leg, upper right leg   No family history on file. Social History  Substance Use Topics  . Smoking status: Former Games developermoker  . Smokeless tobacco: None  . Alcohol Use: No    Review of Systems As in history of present illness Allergies  Review of patient's allergies indicates no known allergies.  Home Medications   Prior to Admission medications   Medication Sig Start Date End Date Taking? Authorizing Provider  Benzoyl Peroxide (BENZOYL PEROXIDE) 10 % LIQD Wash face daily after work. 12/24/15   Charm RingsErin J Mandalyn Pasqua, MD  doxycycline (VIBRAMYCIN) 100 MG capsule Take 1 capsule (100 mg total) by mouth 2 (two) times daily. 07/01/15   Elvina SidleKurt Lauenstein, MD  esomeprazole (NEXIUM) 40 MG capsule Take 1 capsule (40 mg total) by mouth daily. 09/19/15   Fayrene HelperBowie Tran, PA-C  ranitidine (ZANTAC) 150 MG tablet Take 1 tablet (150 mg total) by mouth 2 (two) times daily. 09/19/15   Fayrene HelperBowie Tran, PA-C  sulfamethoxazole-trimethoprim (BACTRIM DS,SEPTRA DS) 800-160 MG tablet Take 1 tablet by mouth 2 (two) times daily. 12/24/15 12/31/15  Charm RingsErin J Genny Caulder, MD   Meds Ordered and Administered  this Visit  Medications - No data to display  BP 113/74 mmHg  Pulse 61  Temp(Src) 98.3 F (36.8 C) (Oral)  Resp 20 No data found.   Physical Exam  Constitutional: He is oriented to person, place, and time. He appears well-developed and well-nourished. No distress.  Neck: Neck supple.  Cardiovascular: Normal rate.   Pulmonary/Chest: Effort normal.  Lymphadenopathy:    He has cervical adenopathy (left anterior chain).  Neurological: He is alert and oriented to person, place, and time.  Skin:  1 cm abscess on bridge of nose. There is crusting. No active drainage. He has another slightly smaller abscess on the left forehead. There is no palpable fluctuance at this time.    ED Course  Procedures (including critical care time)  Labs Review Labs Reviewed - No data to display  Imaging Review No results found.   MDM   1. Facial abscess    Treat with warm compresses and Bactrim. Recommended benzoyl peroxide face wash at the end of his shift to prevent future abscesses from forming. Follow-up as needed.    Charm RingsErin J Lynell Greenhouse, MD 12/24/15 1520

## 2015-12-24 NOTE — Discharge Instructions (Signed)
I suspect these boils are coming from the oily atmosphere at work. Take Bactrim twice a day for the next 7 days. Make sure you take this with food. Apply warm compresses to help the infection drain.  To try and prevent this, use the benzoyl peroxide wash after work every day.  Follow-up as needed.

## 2015-12-24 NOTE — ED Notes (Signed)
Reports boils on face

## 2023-12-25 ENCOUNTER — Emergency Department (HOSPITAL_COMMUNITY): Admitting: Anesthesiology

## 2023-12-25 ENCOUNTER — Other Ambulatory Visit: Payer: Self-pay

## 2023-12-25 ENCOUNTER — Encounter (HOSPITAL_COMMUNITY): Payer: Self-pay | Admitting: Emergency Medicine

## 2023-12-25 ENCOUNTER — Ambulatory Visit (HOSPITAL_COMMUNITY)
Admission: EM | Admit: 2023-12-25 | Discharge: 2023-12-25 | Disposition: A | Discharge status: Home / Self Care | Attending: Emergency Medicine | Admitting: Emergency Medicine

## 2023-12-25 ENCOUNTER — Encounter (HOSPITAL_COMMUNITY)
Admission: EM | Disposition: A | Discharge status: Home / Self Care | Payer: Self-pay | Source: Home / Self Care | Attending: Emergency Medicine

## 2023-12-25 ENCOUNTER — Emergency Department (HOSPITAL_COMMUNITY)

## 2023-12-25 ENCOUNTER — Emergency Department (HOSPITAL_BASED_OUTPATIENT_CLINIC_OR_DEPARTMENT_OTHER): Admitting: Anesthesiology

## 2023-12-25 DIAGNOSIS — I861 Scrotal varices: Secondary | ICD-10-CM | POA: Insufficient documentation

## 2023-12-25 DIAGNOSIS — N4400 Torsion of testis, unspecified: Secondary | ICD-10-CM | POA: Insufficient documentation

## 2023-12-25 HISTORY — DX: Gastro-esophageal reflux disease without esophagitis: K21.9

## 2023-12-25 HISTORY — PX: SCROTAL EXPLORATION: SHX2386

## 2023-12-25 LAB — CBC WITH DIFFERENTIAL/PLATELET
Abs Immature Granulocytes: 0.01 10*3/uL (ref 0.00–0.07)
Basophils Absolute: 0.1 10*3/uL (ref 0.0–0.1)
Basophils Relative: 1 %
Eosinophils Absolute: 0.2 10*3/uL (ref 0.0–0.5)
Eosinophils Relative: 3 %
HCT: 44 % (ref 39.0–52.0)
Hemoglobin: 14.7 g/dL (ref 13.0–17.0)
Immature Granulocytes: 0 %
Lymphocytes Relative: 46 %
Lymphs Abs: 2.8 10*3/uL (ref 0.7–4.0)
MCH: 26.7 pg (ref 26.0–34.0)
MCHC: 33.4 g/dL (ref 30.0–36.0)
MCV: 79.9 fL — ABNORMAL LOW (ref 80.0–100.0)
Monocytes Absolute: 0.7 10*3/uL (ref 0.1–1.0)
Monocytes Relative: 11 %
Neutro Abs: 2.5 10*3/uL (ref 1.7–7.7)
Neutrophils Relative %: 39 %
Platelets: 251 10*3/uL (ref 150–400)
RBC: 5.51 MIL/uL (ref 4.22–5.81)
RDW: 15 % (ref 11.5–15.5)
WBC: 6.2 10*3/uL (ref 4.0–10.5)
nRBC: 0 % (ref 0.0–0.2)

## 2023-12-25 LAB — BASIC METABOLIC PANEL
Anion gap: 11 (ref 5–15)
BUN: 14 mg/dL (ref 6–20)
CO2: 22 mmol/L (ref 22–32)
Calcium: 8.9 mg/dL (ref 8.9–10.3)
Chloride: 103 mmol/L (ref 98–111)
Creatinine, Ser: 1.22 mg/dL (ref 0.61–1.24)
GFR, Estimated: >60 mL/min (ref >60)
Glucose, Bld: 121 mg/dL — ABNORMAL HIGH (ref 70–99)
Potassium: 3.8 mmol/L (ref 3.5–5.1)
Sodium: 136 mmol/L (ref 135–145)

## 2023-12-25 SURGERY — EXPLORATION, SCROTUM
Anesthesia: General | Site: Scrotum | Laterality: Right

## 2023-12-25 MED ORDER — ROCURONIUM BROMIDE 10 MG/ML (PF) SYRINGE
PREFILLED_SYRINGE | INTRAVENOUS | Status: DC | PRN
  Administered 2023-12-25: 5 mg via INTRAVENOUS

## 2023-12-25 MED ORDER — FENTANYL CITRATE (PF) 100 MCG/2ML IJ SOLN
25.0000 ug | INTRAMUSCULAR | Status: DC | PRN
  Administered 2023-12-25: 50 ug via INTRAVENOUS

## 2023-12-25 MED ORDER — BUPIVACAINE HCL 0.25 % IJ SOLN
INTRAMUSCULAR | Status: DC | PRN
  Administered 2023-12-25: 5 mL

## 2023-12-25 MED ORDER — EPHEDRINE SULFATE-NACL 50-0.9 MG/10ML-% IV SOSY
PREFILLED_SYRINGE | INTRAVENOUS | Status: DC | PRN
  Administered 2023-12-25: 5 mg via INTRAVENOUS

## 2023-12-25 MED ORDER — CHLORHEXIDINE GLUCONATE 0.12 % MT SOLN
15.0000 mL | Freq: Once | OROMUCOSAL | Status: AC
  Administered 2023-12-25: 15 mL via OROMUCOSAL
  Filled 2023-12-25: qty 15

## 2023-12-25 MED ORDER — HYDROMORPHONE HCL 1 MG/ML IJ SOLN
1.0000 mg | Freq: Once | INTRAMUSCULAR | Status: AC
  Administered 2023-12-25: 1 mg via INTRAVENOUS
  Filled 2023-12-25: qty 1

## 2023-12-25 MED ORDER — ACETAMINOPHEN 160 MG/5ML PO SOLN: 325.0000 mg | ORAL | Status: DC | PRN

## 2023-12-25 MED ORDER — PHENYLEPHRINE 80 MCG/ML (10ML) SYRINGE FOR IV PUSH (FOR BLOOD PRESSURE SUPPORT)
PREFILLED_SYRINGE | INTRAVENOUS | Status: DC | PRN
  Administered 2023-12-25 (×2): 80 ug via INTRAVENOUS
  Administered 2023-12-25 (×2): 160 ug via INTRAVENOUS
  Administered 2023-12-25: 80 ug via INTRAVENOUS

## 2023-12-25 MED ORDER — ONDANSETRON HCL 4 MG/2ML IJ SOLN
4.0000 mg | Freq: Once | INTRAMUSCULAR | Status: AC
  Administered 2023-12-25: 4 mg via INTRAVENOUS
  Filled 2023-12-25: qty 2

## 2023-12-25 MED ORDER — 0.9 % SODIUM CHLORIDE (POUR BTL) OPTIME
TOPICAL | Status: DC | PRN
  Administered 2023-12-25: 1000 mL

## 2023-12-25 MED ORDER — PROPOFOL 10 MG/ML IV BOLUS
INTRAVENOUS | Status: AC
  Filled 2023-12-25: qty 20

## 2023-12-25 MED ORDER — OXYCODONE HCL 5 MG PO TABS
5.0000 mg | ORAL_TABLET | Freq: Once | ORAL | Status: AC | PRN
  Administered 2023-12-25: 5 mg via ORAL

## 2023-12-25 MED ORDER — MIDAZOLAM HCL 2 MG/2ML IJ SOLN
INTRAMUSCULAR | Status: DC | PRN
  Administered 2023-12-25: 2 mg via INTRAVENOUS

## 2023-12-25 MED ORDER — KETOROLAC TROMETHAMINE 10 MG PO TABS: 10.0000 mg | ORAL_TABLET | Freq: Four times a day (QID) | ORAL | 0 refills | Status: AC | PRN

## 2023-12-25 MED ORDER — BUPIVACAINE HCL (PF) 0.25 % IJ SOLN
INTRAMUSCULAR | Status: AC
  Filled 2023-12-25: qty 30

## 2023-12-25 MED ORDER — ORAL CARE MOUTH RINSE: 15.0000 mL | Freq: Once | OROMUCOSAL | Status: AC

## 2023-12-25 MED ORDER — PHENYLEPHRINE HCL-NACL 20-0.9 MG/250ML-% IV SOLN
INTRAVENOUS | Status: DC | PRN
Start: 2023-12-25 — End: 2023-12-25
  Administered 2023-12-25: 50 ug/min via INTRAVENOUS

## 2023-12-25 MED ORDER — ONDANSETRON HCL 4 MG/2ML IJ SOLN
INTRAMUSCULAR | Status: AC
  Filled 2023-12-25: qty 2

## 2023-12-25 MED ORDER — FENTANYL CITRATE (PF) 250 MCG/5ML IJ SOLN
INTRAMUSCULAR | Status: DC | PRN
  Administered 2023-12-25: 50 ug via INTRAVENOUS
  Administered 2023-12-25: 100 ug via INTRAVENOUS

## 2023-12-25 MED ORDER — LACTATED RINGERS IV SOLN: INTRAVENOUS | Status: DC

## 2023-12-25 MED ORDER — PROPOFOL 10 MG/ML IV BOLUS
INTRAVENOUS | Status: DC | PRN
  Administered 2023-12-25: 150 mg via INTRAVENOUS

## 2023-12-25 MED ORDER — DEXTROSE 5 % IV SOLN
INTRAVENOUS | Status: DC | PRN
  Administered 2023-12-25: 3 g via INTRAVENOUS

## 2023-12-25 MED ORDER — DEXAMETHASONE SODIUM PHOSPHATE 10 MG/ML IJ SOLN
INTRAMUSCULAR | Status: DC | PRN
  Administered 2023-12-25: 10 mg via INTRAVENOUS

## 2023-12-25 MED ORDER — CEFAZOLIN SODIUM-DEXTROSE 3-4 GM/150ML-% IV SOLN
INTRAVENOUS | Status: AC
  Filled 2023-12-25: qty 150

## 2023-12-25 MED ORDER — ACETAMINOPHEN 10 MG/ML IV SOLN: 1000.0000 mg | Freq: Once | INTRAVENOUS | Status: DC | PRN

## 2023-12-25 MED ORDER — OXYCODONE HCL 5 MG/5ML PO SOLN: 5.0000 mg | Freq: Once | ORAL | Status: AC | PRN

## 2023-12-25 MED ORDER — LIDOCAINE 2% (20 MG/ML) 5 ML SYRINGE
INTRAMUSCULAR | Status: AC
  Filled 2023-12-25: qty 5

## 2023-12-25 MED ORDER — FENTANYL CITRATE (PF) 100 MCG/2ML IJ SOLN
INTRAMUSCULAR | Status: AC
Start: 2023-12-25 — End: 2023-12-25
  Administered 2023-12-25: 50 ug via INTRAVENOUS
  Filled 2023-12-25: qty 2

## 2023-12-25 MED ORDER — OXYCODONE HCL 5 MG PO TABS
ORAL_TABLET | ORAL | Status: AC
  Filled 2023-12-25: qty 1

## 2023-12-25 MED ORDER — ACETAMINOPHEN 325 MG PO TABS: 325.0000 mg | ORAL_TABLET | ORAL | Status: DC | PRN

## 2023-12-25 MED ORDER — SUGAMMADEX SODIUM 200 MG/2ML IV SOLN
INTRAVENOUS | Status: DC | PRN
  Administered 2023-12-25: 200 mg via INTRAVENOUS

## 2023-12-25 MED ORDER — HYDROMORPHONE HCL 1 MG/ML IJ SOLN: 0.5000 mg | Freq: Once | INTRAMUSCULAR | Status: DC

## 2023-12-25 MED ORDER — ONDANSETRON HCL 4 MG/2ML IJ SOLN
INTRAMUSCULAR | Status: DC | PRN
  Administered 2023-12-25: 4 mg via INTRAVENOUS

## 2023-12-25 MED ORDER — CHLORHEXIDINE GLUCONATE 0.12 % MT SOLN
OROMUCOSAL | Status: AC
  Filled 2023-12-25: qty 15

## 2023-12-25 MED ORDER — LIDOCAINE 2% (20 MG/ML) 5 ML SYRINGE
INTRAMUSCULAR | Status: DC | PRN
  Administered 2023-12-25: 50 mg via INTRAVENOUS

## 2023-12-25 MED ORDER — OXYCODONE-ACETAMINOPHEN 5-325 MG PO TABS: 1.0000 | ORAL_TABLET | ORAL | 0 refills | Status: AC | PRN

## 2023-12-25 MED ORDER — MIDAZOLAM HCL 2 MG/2ML IJ SOLN
INTRAMUSCULAR | Status: AC
  Filled 2023-12-25: qty 2

## 2023-12-25 MED ORDER — FENTANYL CITRATE (PF) 250 MCG/5ML IJ SOLN
INTRAMUSCULAR | Status: AC
  Filled 2023-12-25: qty 5

## 2023-12-25 MED ORDER — DEXAMETHASONE SODIUM PHOSPHATE 10 MG/ML IJ SOLN
INTRAMUSCULAR | Status: AC
  Filled 2023-12-25: qty 1

## 2023-12-25 MED ORDER — DROPERIDOL 2.5 MG/ML IJ SOLN: 0.6250 mg | Freq: Once | INTRAMUSCULAR | Status: DC | PRN

## 2023-12-25 MED ORDER — SUCCINYLCHOLINE CHLORIDE 200 MG/10ML IV SOSY
PREFILLED_SYRINGE | INTRAVENOUS | Status: DC | PRN
  Administered 2023-12-25: 120 mg via INTRAVENOUS

## 2023-12-25 SURGICAL SUPPLY — 25 items
BAG COUNTER SPONGE SURGICOUNT (BAG) IMPLANT
BLADE HEX COATED 2.75 (ELECTRODE) ×1 IMPLANT
BNDG GAUZE DERMACEA FLUFF 4 (GAUZE/BANDAGES/DRESSINGS) ×1 IMPLANT
COVER SURGICAL LIGHT HANDLE (MISCELLANEOUS) ×1 IMPLANT
DERMABOND ADVANCED .7 DNX12 (GAUZE/BANDAGES/DRESSINGS) IMPLANT
DRAPE LAPAROTOMY 100X72 PEDS (DRAPES) ×1 IMPLANT
DRSG NON-ADHERENT DERMACEA 3X4 (GAUZE/BANDAGES/DRESSINGS) IMPLANT
ELECT NDL TIP 2.8 STRL (NEEDLE) ×1 IMPLANT
ELECT NEEDLE TIP 2.8 STRL (NEEDLE) IMPLANT
ELECT REM PT RETURN 15FT ADLT (MISCELLANEOUS) ×1 IMPLANT
GAUZE SPONGE 4X4 12PLY STRL (GAUZE/BANDAGES/DRESSINGS) IMPLANT
GLOVE BIOGEL M STRL SZ7.5 (GLOVE) ×1 IMPLANT
GOWN STRL REUS W/ TWL LRG LVL3 (GOWN DISPOSABLE) ×1 IMPLANT
KIT BASIN OR (CUSTOM PROCEDURE TRAY) ×1 IMPLANT
NDL HYPO 22X1.5 SAFETY MO (MISCELLANEOUS) IMPLANT
NEEDLE HYPO 22X1.5 SAFETY MO (MISCELLANEOUS) IMPLANT
NS IRRIG 1000ML POUR BTL (IV SOLUTION) ×1 IMPLANT
PACK GENERAL/GYN (CUSTOM PROCEDURE TRAY) ×1 IMPLANT
SUPPORT SCROTAL LG STRP (MISCELLANEOUS) ×1 IMPLANT
SUT CHROMIC 3 0 SH 27 (SUTURE) ×2 IMPLANT
SUT MNCRL AB 4-0 PS2 18 (SUTURE) IMPLANT
SUT PDS AB 4-0 SH 27 (SUTURE) IMPLANT
SYR CONTROL 10ML LL (SYRINGE) IMPLANT
TOWEL GREEN STERILE (TOWEL DISPOSABLE) ×2 IMPLANT
WATER STERILE IRR 1000ML POUR (IV SOLUTION) ×1 IMPLANT

## 2023-12-25 NOTE — ED Notes (Signed)
 Patient transported to Ultrasound

## 2023-12-25 NOTE — ED Provider Notes (Signed)
 [facility] Emergency Department Provider Note MRN:  [id number]  Arrival date & time: 12/25/23     Chief Complaint   Testicle Pain   History of Present Illness   Angel Lamb is a 33 y.o. year-old male presents to the ED with chief complaint of right testicle pain.  Onset was 1 hr ago.  Awakened him from sleep.  Reports severe 10/10 pain.  Radiates into his groin.  Hasn't urinated.  States it feels worse than being "kicked in the balls."  Reports associated nausea.  History provided by patient.   Review of Systems  Pertinent positive and negative review of systems noted in HPI.    Physical Exam   Vitals:   12/25/23 0511  BP: 123/85  Pulse: 82  Resp: (!) 22  Temp: 98.2 F (36.8 C)  SpO2: 100%    CONSTITUTIONAL:  uncomfortable-appearing, NAD NEURO:  Alert and oriented x 3, CN 3-12 grossly intact EYES:  eyes equal and reactive ENT/NECK:  Supple, no stridor  CARDIO:  normal rate, regular rhythm, appears well-perfused  PULM:  No respiratory distress, CTAB GI/GU:  non-distended, high riding right testicle, TTP, chaperone present, no penile discharge MSK/SPINE:  No gross deformities, no edema, moves all extremities  SKIN:  no rash, atraumatic   *Additional and/or pertinent findings included in MDM below  Diagnostic and Interventional Summary    EKG Interpretation Date/Time:    Ventricular Rate:    PR Interval:    QRS Duration:    QT Interval:    QTC Calculation:   R Axis:      Text Interpretation:         Labs Reviewed  CBC WITH DIFFERENTIAL/PLATELET - Abnormal; Notable for the following components:      Result Value   MCV 79.9 (*)    All other components within normal limits  BASIC METABOLIC PANEL - Abnormal; Notable for the following components:   Glucose, Bld 121 (*)    All other components within normal limits  URINALYSIS, ROUTINE W REFLEX MICROSCOPIC    US SCROTUM W/DOPPLER    (Results Pending)    Medications   HYDROmorphone (DILAUDID) injection 1 mg (has no administration in time range)  HYDROmorphone (DILAUDID) injection 1 mg (1 mg Intravenous Given 12/25/23 0530)  ondansetron (ZOFRAN) injection 4 mg (4 mg Intravenous Given 12/25/23 0530)     Procedures  /  Critical Care .Critical Care  Performed by: Roxy Horseman, PA-C Authorized by: Roxy Horseman, PA-C   Critical care provider statement:    Critical care time (minutes):  36   Critical care was necessary to treat or prevent imminent or life-threatening deterioration of the following conditions:  Circulatory failure   Critical care was time spent personally by me on the following activities:  Development of treatment plan with patient or surrogate, discussions with consultants, evaluation of patient's response to treatment, examination of patient, ordering and review of laboratory studies, ordering and review of radiographic studies, ordering and performing treatments and interventions, pulse oximetry, re-evaluation of patient's condition and review of old charts   ED Course and Medical Decision Making  I have reviewed the triage vital signs, the nursing notes, and pertinent available records from the EMR.  Social Determinants Affecting Complexity of Care: Patient has no clinically significant social determinants affecting this chief complaint..   ED Course:    Medical Decision Making Patient here with right testicle pain.  Sudden onset.  I have concern for torsion.  [place] ordered.  Pain medicine  ordered.  I escorted the patient to the [place] suite, where torsion was confirmed.  I consulted with Dr. Liliane Shi and gave him my preliminary interpretation.  He states he will start to mobilize.    I called and discussed [place] with Dr. Molli Posey, radiology, who confirms findings consistent with torsion.  I updated Dr. Liliane Shi, who is on his way to see patient.  Amount and/or Complexity of Data Reviewed Labs: ordered. Radiology: ordered and independent  interpretation performed.    Details: No flow to right testicle.  Worrisome for testicular torsion.  Risk Prescription drug management. Decision regarding hospitalization.         Consultants: I consulted with Dr. Liliane Shi, who recommends OR management of testicular torsion.   Treatment and Plan: Patient's exam and diagnostic results are concerning for testicular torsion.  Feel that patient will need admission to the hospital for further treatment and evaluation.    Final Clinical Impressions(s) / ED Diagnoses     ICD-10-CM   1. Testicular torsion  N44.00       ED Discharge Orders     None         Discharge Instructions Discussed with and Provided to Patient:   Discharge Instructions   None      Roxy Horseman, PA-C 12/25/23 1610    Zadie Rhine, MD 12/25/23 [phone]

## 2023-12-25 NOTE — Consult Note (Signed)
 Urology Consult   Physician requesting consult: Zadie Rhine, MD  Reason for consult: Right testicular torsion  History of Present Illness: Angel Lamb is a 33 y.o. male who presents to the emergency department with acute onset of right testicular pain that started around 4 AM this morning and was associated with nausea and vomiting.  He underwent a scrotal ultrasound in the emergency department is found to have right testicular torsion.   The patient denies a history of voiding or storage urinary symptoms, hematuria, UTIs, STDs, urolithiasis, GU malignancy/trauma/surgery.  Past Medical History:  Diagnosis Date   Reflux     Past Surgical History:  Procedure Laterality Date   LEG SURGERY     lower left leg, upper right leg    Current Hospital Medications:  Home Meds:  No outpatient medications have been marked as taking for the 12/25/23 encounter [facility] Encounter).    Scheduled Meds: Continuous Infusions: PRN Meds:.  Allergies: No Known Allergies  History reviewed. No pertinent family history.  Social History:  reports that he has quit smoking. He does not have any smokeless tobacco history on file. He reports that he does not drink alcohol and does not use drugs.  ROS: A complete review of systems was performed.  All systems are negative except for pertinent findings as noted.  Physical Exam:  Vital signs in last 24 hours: Temp:  [98.2 F (36.8 C)] 98.2 F (36.8 C) (03/23 0511) Pulse Rate:  [82] 82 (03/23 0511) Resp:  [22] 22 (03/23 0511) BP: (123)/(85) 123/85 (03/23 0511) SpO2:  [100 %] 100 % (03/23 0511) Weight:  [81.6 kg] 81.6 kg (03/23 0525) Constitutional:  Alert and oriented, No acute distress Cardiovascular: Regular rate and rhythm, No JVD Respiratory: Normal respiratory effort, Lungs clear bilaterally GI: Abdomen is soft, nontender, nondistended, no abdominal masses GU: Right testicle is firm, high riding and exquisitely tender to palpation.  No  palpable abnormalities are exhibited involving the left testicle Lymphatic: No lymphadenopathy Neurologic: Grossly intact, no focal deficits Psychiatric: Normal mood and affect  Laboratory Data:  Recent Labs    12/25/23 0523  WBC 6.2  HGB 14.7  HCT 44.0  PLT 251    Recent Labs    12/25/23 0523  NA 136  K 3.8  CL 103  GLUCOSE 121*  BUN 14  CALCIUM 8.9  CREATININE 1.22     Results for orders placed or performed during the hospital encounter of 12/25/23 (from the past 24 hours)  CBC with Differential     Status: Abnormal   Collection Time: 12/25/23  5:23 AM  Result Value Ref Range   WBC 6.2 4.0 - 10.5 K/uL   RBC 5.51 4.22 - 5.81 MIL/uL   Hemoglobin 14.7 13.0 - 17.0 g/dL   HCT 81.1 91.4 - 78.2 %   MCV 79.9 (L) 80.0 - 100.0 fL   MCH 26.7 26.0 - 34.0 pg   MCHC 33.4 30.0 - 36.0 g/dL   RDW 95.6 21.3 - 08.6 %   Platelets 251 150 - 400 K/uL   nRBC 0.0 0.0 - 0.2 %   Neutrophils Relative % 39 %   Neutro Abs 2.5 1.7 - 7.7 K/uL   Lymphocytes Relative 46 %   Lymphs Abs 2.8 0.7 - 4.0 K/uL   Monocytes Relative 11 %   Monocytes Absolute 0.7 0.1 - 1.0 K/uL   Eosinophils Relative 3 %   Eosinophils Absolute 0.2 0.0 - 0.5 K/uL   Basophils Relative 1 %   Basophils Absolute 0.1  0.0 - 0.1 K/uL   Immature Granulocytes 0 %   Abs Immature Granulocytes 0.01 0.00 - 0.07 K/uL  Basic metabolic panel     Status: Abnormal   Collection Time: 12/25/23  5:23 AM  Result Value Ref Range   Sodium 136 135 - 145 mmol/L   Potassium 3.8 3.5 - 5.1 mmol/L   Chloride 103 98 - 111 mmol/L   CO2 22 22 - 32 mmol/L   Glucose, Bld 121 (H) 70 - 99 mg/dL   BUN 14 6 - 20 mg/dL   Creatinine, Ser 1.61 0.61 - 1.24 mg/dL   Calcium 8.9 8.9 - 09.6 mg/dL   GFR, Estimated >04 >54 mL/min   Anion gap 11 5 - 15   No results found for this or any previous visit (from the past 240 hours).  Renal Function: Recent Labs    12/25/23 0523  CREATININE 1.22   CrCl cannot be calculated (Unknown ideal  weight.).  Radiologic Imaging: US SCROTUM W/DOPPLER Result Date: 12/25/2023 CLINICAL DATA:  Right testicular pain. EXAM: SCROTAL ULTRASOUND DOPPLER ULTRASOUND OF THE TESTICLES TECHNIQUE: Complete ultrasound examination of the testicles, epididymis, and other scrotal structures was performed. Color and spectral Doppler ultrasound were also utilized to evaluate blood flow to the testicles. COMPARISON:  None Available. FINDINGS: Right testicle Measurements: 3.8 x 2.2 x 3.6 cm. No mass or microlithiasis visualized. Left testicle Measurements: 4.1 x 2.2 x 3.1 cm. No mass or microlithiasis visualized. Right epididymis:  No mass lesion. Left epididymis:  No mass lesion. Hydrocele:  None visualized. Varicocele: Venous anatomy in the left hemiscrotum measures up to 3-4 mm diameter, compatible with varicocele. No right-sided varicocele. Pulsed Doppler interrogation of both testes demonstrates normal low resistance arterial and venous waveforms in the left testicular parenchyma. There is no flow signal in the right testicle on color Doppler or pulsed Doppler evaluation. IMPRESSION: 1. Lack of demonstrable flow signal in the right testicular parenchyma on color Doppler and pulsed Doppler evaluation is consistent with right testicular torsion. 2. Left-sided varicocele. Critical Value/emergent results were called by telephone at the time of interpretation on 12/25/2023 at 6:04 am to provider Roxy Horseman , who verbally acknowledged these results. Electronically Signed   By: Kennith Center M.D.   On: 12/25/2023 06:11    I independently reviewed the above imaging studies.  Impression/Recommendation 33 year old male with right testicular torsion  -The risk, benefits and alternatives of scrotal exploration with possible right orchiectomy, possible bilateral orchiopexy was discussed with the patient.  Risks include, but are not limited to, bleeding complications, infection, testicular loss, chronic testicular pain,  recurrence of testicular torsion, MI, PE, DVT and the inherent risk of general anesthesia.  He voices understanding and wishes to proceed.  Rhoderick Moody, MD [facility] 12/25/2023, 6:52 AM

## 2023-12-25 NOTE — ED Notes (Signed)
Pt states NPO since 0100

## 2023-12-25 NOTE — ED Notes (Signed)
Pt transported to OR at this time  

## 2023-12-25 NOTE — ED Triage Notes (Signed)
 Pt in with R testicle pain that began suddenly an hr ago. Pain radiates to low abdomen and back

## 2023-12-25 NOTE — ED Notes (Signed)
 OR called at this time to request that someone transport patient upstairs at this time

## 2023-12-25 NOTE — Anesthesia Procedure Notes (Signed)
 Procedure Name: Intubation Date/Time: 12/25/2023 7:44 AM  Performed by: Gloris Ham, CRNAPre-anesthesia Checklist: Patient identified, Emergency Drugs available, Suction available and Patient being monitored Patient Re-evaluated:Patient Re-evaluated prior to induction Oxygen Delivery Method: Circle System Utilized Preoxygenation: Pre-oxygenation with 100% oxygen Induction Type: IV induction Ventilation: Mask ventilation without difficulty Laryngoscope Size: Mac and 3 Grade View: Grade II Tube type: Oral Tube size: 7.5 mm Number of attempts: 1 Airway Equipment and Method: Stylet and Oral airway Placement Confirmation: ETT inserted through vocal cords under direct vision, positive ETCO2 and breath sounds checked- equal and bilateral Tube secured with: Tape Dental Injury: Teeth and Oropharynx as per pre-operative assessment

## 2023-12-25 NOTE — Transfer of Care (Signed)
 Immediate Anesthesia Transfer of Care Note  Patient: Angel Lamb  Procedure(s) Performed: BILATERAL ORCHIOPEXY (Right: Scrotum)  Patient Location: PACU  Anesthesia Type:General  Level of Consciousness: awake and alert   Airway & Oxygen Therapy: Patient Spontanous Breathing  Post-op Assessment: Report given to RN, Post -op Vital signs reviewed and stable, Patient moving all extremities, and Patient moving all extremities X 4  Post vital signs: Reviewed and stable  Last Vitals:  Vitals Value Taken Time  BP 128/75   Temp 98   Pulse 102 12/25/23 0852  Resp 13 12/25/23 0852  SpO2 96 % 12/25/23 0852  Vitals shown include unfiled device data.  Last Pain:  Vitals:   12/25/23 0614  PainSc: 8          Complications: There were no known notable events for this encounter.

## 2023-12-25 NOTE — Anesthesia Postprocedure Evaluation (Signed)
 Anesthesia Post Note  Patient: Angel Lamb  Procedure(s) Performed: BILATERAL ORCHIOPEXY (Right: Scrotum)     Patient location during evaluation: PACU Anesthesia Type: General Level of consciousness: awake and alert Pain management: pain level controlled Vital Signs Assessment: post-procedure vital signs reviewed and stable Respiratory status: spontaneous breathing, nonlabored ventilation, respiratory function stable and patient connected to nasal cannula oxygen Cardiovascular status: blood pressure returned to baseline and stable Postop Assessment: no apparent nausea or vomiting Anesthetic complications: no  There were no known notable events for this encounter.  Last Vitals:  Vitals:   12/25/23 0915 12/25/23 0930  BP: (!) 141/82 104/67  Pulse: 96 85  Resp: 18 16  Temp:    SpO2: 94% 93%    Last Pain:  Vitals:   12/25/23 0930  PainSc: 5                  Shelton Silvas

## 2023-12-25 NOTE — Op Note (Signed)
 Operative Note  Preoperative diagnosis:  1.  Right testicular torsion  Postoperative diagnosis: 1.  Right testicular torsion  Procedure(s): 1.  Scrotal exploration with detorsion of the right testicle and bilateral orchiopexy  Surgeon: Rhoderick Moody, MD  Assistants:  None  Anesthesia:  General  Complications:  None  EBL: 10 mL  Specimens: 1.  None  Drains/Catheters: 1.  None  Intraoperative findings:   Torsion of right spermatic cord with viable right testicle following detorsion No abnormalities were seen involving the left testicle  Indication:  Angel Lamb is a 33 y.o. male with acute onset of right testicular pain with sonographic findings consistent with right testicular torsion.  He has been consented for the above procedures, voices understanding and wished to proceed.  Description of procedure:  After informed consent was obtained, the patient was brought to the operating room and general endotracheal anesthesia was administered.  The patient was placed in the supine position and prepped and draped in usual sterile fashion.  A timeout was performed.  A 4 cm vertical incision was then made along the midportion of the median raphae and the overlying cremasteric and dartos layers were incised using electrocautery.  The right testicle was then expressed out of the tunica vaginalis and found to be torsed 720 degrees around the distal aspects of the spermatic cord with mild ecchymosis.  The testicle was then detorsed and the right testicle immediately showed improved perfusion and was completely viable.  I then placed the right testicle back into the right hemiscrotum in the correct anatomic orientation, assuring no persistent torsion of the spermatic cord.  I then performed an orchiopexy along the medial, inferior and lateral aspects of the testicle, pexing the tunica albuginea to the tunica vaginalis using 4-0 PDS in these locations.  The left testicle was then  expressed out of the left hemiscrotum and was found to be viable with no gross abnormalities.  The left testicle was then placed back into the left hemiscrotum in the correct anatomic orientation and pexied in a similar fashion.  The dartos and cremasteric layers were then closed using a 3-0 chromic suture and the skin was reapproximated using a running 4-0 Monocryl and dressed with Dermabond.  He tolerated the procedure well and was transferred to the postanesthesia in stable condition.  Plan: Discharge home

## 2023-12-25 NOTE — Anesthesia Preprocedure Evaluation (Addendum)
 Anesthesia Evaluation  Patient identified by MRN, date of birth, ID band Patient awake    Reviewed: Allergy & Precautions, NPO status , Patient's Chart, lab work & pertinent test results  Airway Mallampati: II       Dental no notable dental hx.    Pulmonary former smoker   Pulmonary exam normal        Cardiovascular negative cardio ROS Normal cardiovascular exam     Neuro/Psych  PSYCHIATRIC DISORDERS  Depression       GI/Hepatic Neg liver ROS,GERD  Medicated,,  Endo/Other  negative endocrine ROS    Renal/GU negative Renal ROS     Musculoskeletal negative musculoskeletal ROS (+)    Abdominal   Peds  Hematology negative hematology ROS (+)   Anesthesia Other Findings   Reproductive/Obstetrics                             Anesthesia Physical Anesthesia Plan  ASA: 2 and emergent  Anesthesia Plan: General   Post-op Pain Management: Tylenol PO (pre-op)* and Toradol IV (intra-op)*   Induction: Intravenous  PONV Risk Score and Plan: 3 and Ondansetron, Dexamethasone and Midazolam  Airway Management Planned: Oral ETT  Additional Equipment: None  Intra-op Plan:   Post-operative Plan: Extubation in OR  Informed Consent: I have reviewed the patients History and Physical, chart, labs and discussed the procedure including the risks, benefits and alternatives for the proposed anesthesia with the patient or authorized representative who has indicated his/her understanding and acceptance.       Plan Discussed with: CRNA  Anesthesia Plan Comments:        Anesthesia Quick Evaluation

## 2023-12-26 ENCOUNTER — Encounter (HOSPITAL_COMMUNITY): Payer: Self-pay | Admitting: Urology
